# Patient Record
Sex: Female | Born: 1990 | Race: White | Hispanic: Yes | Marital: Single | Smoking: Never smoker
Health system: Southern US, Community
[De-identification: ages and names within clinical notes are randomized; demographics above are authoritative.]

---

## 2018-03-07 ENCOUNTER — Emergency Department (HOSPITAL_COMMUNITY): Payer: Self-pay

## 2018-03-07 ENCOUNTER — Encounter (HOSPITAL_COMMUNITY): Payer: Self-pay | Admitting: Emergency Medicine

## 2018-03-07 ENCOUNTER — Other Ambulatory Visit: Payer: Self-pay

## 2018-03-07 ENCOUNTER — Emergency Department (HOSPITAL_COMMUNITY)
Admission: EM | Admit: 2018-03-07 | Discharge: 2018-03-07 | Disposition: A | Discharge status: Home / Self Care | Payer: Self-pay | Attending: Emergency Medicine | Admitting: Emergency Medicine

## 2018-03-07 DIAGNOSIS — Z23 Encounter for immunization: Secondary | ICD-10-CM | POA: Insufficient documentation

## 2018-03-07 DIAGNOSIS — S41111A Laceration without foreign body of right upper arm, initial encounter: Secondary | ICD-10-CM | POA: Insufficient documentation

## 2018-03-07 DIAGNOSIS — Y929 Unspecified place or not applicable: Secondary | ICD-10-CM | POA: Insufficient documentation

## 2018-03-07 DIAGNOSIS — Y999 Unspecified external cause status: Secondary | ICD-10-CM | POA: Insufficient documentation

## 2018-03-07 DIAGNOSIS — Y939 Activity, unspecified: Secondary | ICD-10-CM | POA: Insufficient documentation

## 2018-03-07 MED ORDER — CEPHALEXIN 500 MG PO CAPS: 500.0000 mg | ORAL_CAPSULE | Freq: Three times a day (TID) | ORAL | 0 refills | Status: AC

## 2018-03-07 MED ORDER — LIDOCAINE-EPINEPHRINE (PF) 2 %-1:200000 IJ SOLN
20.0000 mL | Freq: Once | INTRAMUSCULAR | Status: AC
  Administered 2018-03-07: 20 mL
  Filled 2018-03-07: qty 20

## 2018-03-07 MED ORDER — IBUPROFEN 600 MG PO TABS: 600.0000 mg | ORAL_TABLET | Freq: Four times a day (QID) | ORAL | 0 refills | Status: AC | PRN

## 2018-03-07 MED ORDER — CEPHALEXIN 500 MG PO CAPS
500.0000 mg | ORAL_CAPSULE | Freq: Once | ORAL | Status: AC
  Administered 2018-03-07: 500 mg via ORAL
  Filled 2018-03-07: qty 1

## 2018-03-07 MED ORDER — HYDROCODONE-ACETAMINOPHEN 5-325 MG PO TABS: 1.0000 | ORAL_TABLET | Freq: Four times a day (QID) | ORAL | 0 refills | Status: AC | PRN

## 2018-03-07 MED ORDER — TETANUS-DIPHTH-ACELL PERTUSSIS 5-2.5-18.5 LF-MCG/0.5 IM SUSP
0.5000 mL | Freq: Once | INTRAMUSCULAR | Status: AC
  Administered 2018-03-07: 0.5 mL via INTRAMUSCULAR
  Filled 2018-03-07: qty 0.5

## 2018-03-07 MED ORDER — HYDROCODONE-ACETAMINOPHEN 5-325 MG PO TABS
1.0000 | ORAL_TABLET | Freq: Once | ORAL | Status: AC
  Administered 2018-03-07: 1 via ORAL
  Filled 2018-03-07: qty 1

## 2018-03-07 MED ORDER — POVIDONE-IODINE 10 % EX SOLN
CUTANEOUS | Status: AC
  Filled 2018-03-07: qty 15

## 2018-03-07 NOTE — Progress Notes (Signed)
Patient entered as Janet Page patient correct name Janet Page.  Rest of information correct.  Notified registration new armband made for patient and information corrected.
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
## 2018-03-07 NOTE — ED Notes (Signed)
Wounds cleansed. Bandage placed on R shoulder wound

## 2018-03-07 NOTE — Discharge Instructions (Signed)
Follow-up with orthopedist to reassess for any weakness or numbness and for suture removal.  Return immediately to the emergency department for numbness, redness, warmth, fever or for any concerns.
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
